# Patient Record
Sex: Male | Born: 1955 | Race: White | Hispanic: No | Marital: Single
Health system: Southern US, Community
[De-identification: ages and names within clinical notes are randomized; demographics above are authoritative.]

## PROBLEM LIST (undated history)

## (undated) DIAGNOSIS — Z9889 Other specified postprocedural states: Secondary | ICD-10-CM

## (undated) DIAGNOSIS — G459 Transient cerebral ischemic attack, unspecified: Secondary | ICD-10-CM

## (undated) DIAGNOSIS — G4730 Sleep apnea, unspecified: Secondary | ICD-10-CM

---

## 2017-02-21 ENCOUNTER — Emergency Department (HOSPITAL_COMMUNITY): Payer: Non-veteran care

## 2017-02-21 ENCOUNTER — Encounter (HOSPITAL_COMMUNITY): Payer: Self-pay | Admitting: Emergency Medicine

## 2017-02-21 ENCOUNTER — Other Ambulatory Visit: Payer: Self-pay

## 2017-02-21 ENCOUNTER — Emergency Department (HOSPITAL_COMMUNITY)
Admission: EM | Admit: 2017-02-21 | Discharge: 2017-02-21 | Disposition: A | Discharge status: Home / Self Care | Payer: Non-veteran care | Attending: Emergency Medicine | Admitting: Emergency Medicine

## 2017-02-21 DIAGNOSIS — M109 Gout, unspecified: Secondary | ICD-10-CM | POA: Diagnosis not present

## 2017-02-21 DIAGNOSIS — Z79899 Other long term (current) drug therapy: Secondary | ICD-10-CM | POA: Diagnosis not present

## 2017-02-21 DIAGNOSIS — M79675 Pain in left toe(s): Secondary | ICD-10-CM | POA: Diagnosis present

## 2017-02-21 HISTORY — DX: Other specified postprocedural states: Z98.890

## 2017-02-21 HISTORY — DX: Transient cerebral ischemic attack, unspecified: G45.9

## 2017-02-21 HISTORY — DX: Sleep apnea, unspecified: G47.30

## 2017-02-21 MED ORDER — NAPROXEN 500 MG PO TABS: 500.0000 mg | ORAL_TABLET | Freq: Two times a day (BID) | ORAL | 0 refills | Status: DC

## 2017-02-21 MED ORDER — NAPROXEN 500 MG PO TABS: 500.0000 mg | ORAL_TABLET | Freq: Two times a day (BID) | ORAL | 0 refills | Status: AC

## 2017-02-21 MED ORDER — COLCHICINE 0.6 MG PO TABS
1.2000 mg | ORAL_TABLET | Freq: Once | ORAL | Status: AC
  Administered 2017-02-21: 1.2 mg via ORAL
  Filled 2017-02-21: qty 2

## 2017-02-21 MED ORDER — COLCHICINE 0.6 MG PO TABS: 0.6000 mg | ORAL_TABLET | Freq: Every day | ORAL | 0 refills | Status: DC

## 2017-02-21 MED ORDER — NAPROXEN 250 MG PO TABS
500.0000 mg | ORAL_TABLET | Freq: Once | ORAL | Status: AC
  Administered 2017-02-21: 500 mg via ORAL
  Filled 2017-02-21: qty 2

## 2017-02-21 MED ORDER — COLCHICINE 0.6 MG PO TABS: 0.6000 mg | ORAL_TABLET | Freq: Every day | ORAL | 0 refills | Status: AC

## 2017-02-21 NOTE — Discharge Instructions (Addendum)
Medications: Naprosyn, colchicine  Treatment: Take Naprosyn twice daily for 1 week.  Take colchicine once daily for 4 days.  Follow-up: Please follow-up with your doctor for further evaluation and treatment.  Please return to the emergency department if you develop any new or worsening symptoms including spreading of pain and redness, fevers, or any other new or concerning symptoms.

## 2017-02-21 NOTE — ED Notes (Signed)
Pt placed in a post op shoe, and crutches were properly sized to the pt's height.

## 2017-02-21 NOTE — ED Provider Notes (Signed)
[facility] EMERGENCY DEPARTMENT Provider Note   CSN: [id number] Arrival date & time: 02/21/17  4098     History   Chief Complaint Chief Complaint  Patient presents with  . Toe Pain    HPI Mark Prince is a 61 y.o. male with history of TIA, and chronic back pain who presents with acute onset left great toe pain that began yesterday.  He has had associated redness and warmth to the area.  He has no history of gout.  He denies any numbness or tingling.  He has no other complaints.  He denies any fevers.  He has been able to walk, however with pain.  HPI  Past Medical History:  Diagnosis Date  . Hx of neck surgery   . Sleep apnea   . TIA (transient ischemic attack)     There are no active problems to display for this patient.   History reviewed. No pertinent surgical history.     Home Medications    Prior to Admission medications   Medication Sig Start Date End Date Taking? Authorizing Provider  ARIPiprazole (ABILIFY) 10 MG tablet Take 10 mg by mouth daily.   Yes [provider]  DULoxetine (CYMBALTA) 30 MG capsule Take 90 mg by mouth daily.   Yes [provider]  finasteride (PROSCAR) 5 MG tablet Take 5 mg by mouth daily.   Yes [provider]  furosemide (LASIX) 10 MG/ML solution Take by mouth daily.   Yes [provider]  gabapentin (NEURONTIN) 300 MG capsule Take 600 mg by mouth 3 (three) times daily.   Yes [provider]  naproxen (NAPROSYN) 500 MG tablet Take 500 mg by mouth 2 (two) times daily as needed for mild pain.   Yes [provider]  tamsulosin (FLOMAX) 0.4 MG CAPS capsule Take 0.8 mg by mouth daily after supper.   Yes [provider]  traZODone (DESYREL) 100 MG tablet Take 100 mg by mouth at bedtime.   Yes [provider]  colchicine 0.6 MG tablet Take 1 tablet (0.6 mg total) by mouth daily. 02/21/17   [facility], Waylan Boga, PA-C  naproxen (NAPROSYN) 500 MG tablet Take  1 tablet (500 mg total) by mouth 2 (two) times daily. 02/21/17   Emi Holes, PA-C    Family History No family history on file.  Social History Social History   Tobacco Use  . Smoking status: Not on file  Substance Use Topics  . Alcohol use: Not on file  . Drug use: Not on file     Allergies   Percocet [oxycodone-acetaminophen]; Tolmetin; and Vistaril [hydroxyzine hcl]   Review of Systems Review of Systems  Constitutional: Negative for fever.  Musculoskeletal: Positive for arthralgias (L great MTP).     Physical Exam Updated Vital Signs BP (!) 116/95 (BP Location: Right Arm)   Pulse (!) 115   Temp 98 F (36.7 C)   Resp 16   Ht 6' (1.829 m)   Wt 119.3 kg (263 lb)   SpO2 95%   BMI 35.67 kg/m   Physical Exam  Constitutional: He appears well-developed and well-nourished. No distress.  HENT:  Head: Normocephalic and atraumatic.  Mouth/Throat: Oropharynx is clear and moist. No oropharyngeal exudate.  Eyes: Conjunctivae are normal. Pupils are equal, round, and reactive to light. Right eye exhibits no discharge. Left eye exhibits no discharge. No scleral icterus.  Neck: Normal range of motion. Neck supple. No thyromegaly present.  Cardiovascular: Normal rate, regular rhythm, normal heart  sounds and intact distal pulses. Exam reveals no gallop and no friction rub.  No murmur heard. Pulmonary/Chest: Effort normal and breath sounds normal. No stridor. No respiratory distress. He has no wheezes. He has no rales.  Abdominal: Soft. Bowel sounds are normal. He exhibits no distension. There is no tenderness. There is no rebound and no guarding.  Musculoskeletal: He exhibits no edema.       Feet:  No tenderness to the left ankle or leg; erythema is localized over the first MTP  Lymphadenopathy:    He has no cervical adenopathy.  Neurological: He is alert. Coordination normal.  Skin: Skin is warm and dry. No rash noted. He is not diaphoretic. No pallor.  Psychiatric: He  has a normal mood and affect.  Nursing note and vitals reviewed.    ED Treatments / Results  Labs (all labs ordered are listed, but only abnormal results are displayed) Labs Reviewed - No data to display  EKG  EKG Interpretation None       Radiology Dg Foot Complete Left  Result Date: 02/21/2017 CLINICAL DATA:  Pain for 24 hours. EXAM: LEFT FOOT - COMPLETE 3+ VIEW COMPARISON:  No prior. FINDINGS: Soft tissue swelling noted of the first MTP joint. No radiopaque foreign body. Cystic changes noted the distal aspect of the first metatarsal. Although these changes may be degenerative a process such as gout cannot be excluded. No evidence fracture or dislocation. IMPRESSION: Soft tissue swelling the first MTP joint. Cystic changes in the distal aspect of the first metatarsal. Although these changes may be degenerative a process such as gout cannot be excluded. No evidence of fracture or dislocation. Electronically Signed   By: Maisie Fushomas  Register   On: 02/21/2017 11:59    Procedures Procedures (including critical care time)  Medications Ordered in ED Medications  naproxen (NAPROSYN) tablet 500 mg (500 mg Oral Given 02/21/17 1227)  colchicine tablet 1.2 mg (1.2 mg Oral Given 02/21/17 1226)     Initial Impression / Assessment and Plan / ED Course  I have reviewed the triage vital signs and the nursing notes.  Pertinent labs & imaging results that were available during my care of the patient were reviewed by me and considered in my medical decision making (see chart for details).     Patient with suspected gout to left great MTP joint.  X-ray shows cystic changes in the distal aspect of the first metatarsal, although these changes may be degenerative or process such as gout cannot be excluded; no evidence of fracture or dislocation.  Will treat with Naprosyn and colchicine.  First dose was given in the ED.  Will refer for follow-up to PCP.  Patient requesting crutches to get around.  We  will also give postop shoe for support.  Return precautions discussed.  Patient understands and agrees with plan.  Patient has chronic mild tachycardia, per patient.  Otherwise patient vitals stable throughout ED course and discharged in satisfactory condition.  Final Clinical Impressions(s) / ED Diagnoses   Final diagnoses:  Acute gout involving toe of left foot, unspecified cause    ED Discharge Orders        Ordered    naproxen (NAPROSYN) 500 MG tablet  2 times daily,   Status:  Discontinued     02/21/17 1217    colchicine 0.6 MG tablet  Daily,   Status:  Discontinued     02/21/17 1217    colchicine 0.6 MG tablet  Daily     02/21/17 1219  naproxen (NAPROSYN) 500 MG tablet  2 times daily     02/21/17 1219       Emi HolesLaw, Iliany Losier M, [place]PA-C 02/21/17 1329    Lavera GuiseLiu, Dana Duo, MD 02/21/17 [phone]

## 2017-02-21 NOTE — ED Triage Notes (Addendum)
Pt states he has a "bunyon" on the bottom of his right great toe. States yesterday he has pain to right great toe. No drainage redness or swelling noted to toe. Denies injury Also [place] [facility]. HR 116 in triage.

## 2019-01-06 IMAGING — CR DG FOOT COMPLETE 3+V*L*
3 series · 3 of 3 positions shown · non-contrast
Comparison: No prior.

CLINICAL DATA: Pain for 24 hours.

EXAM:
LEFT FOOT - COMPLETE 3+ VIEW

[foot ap]
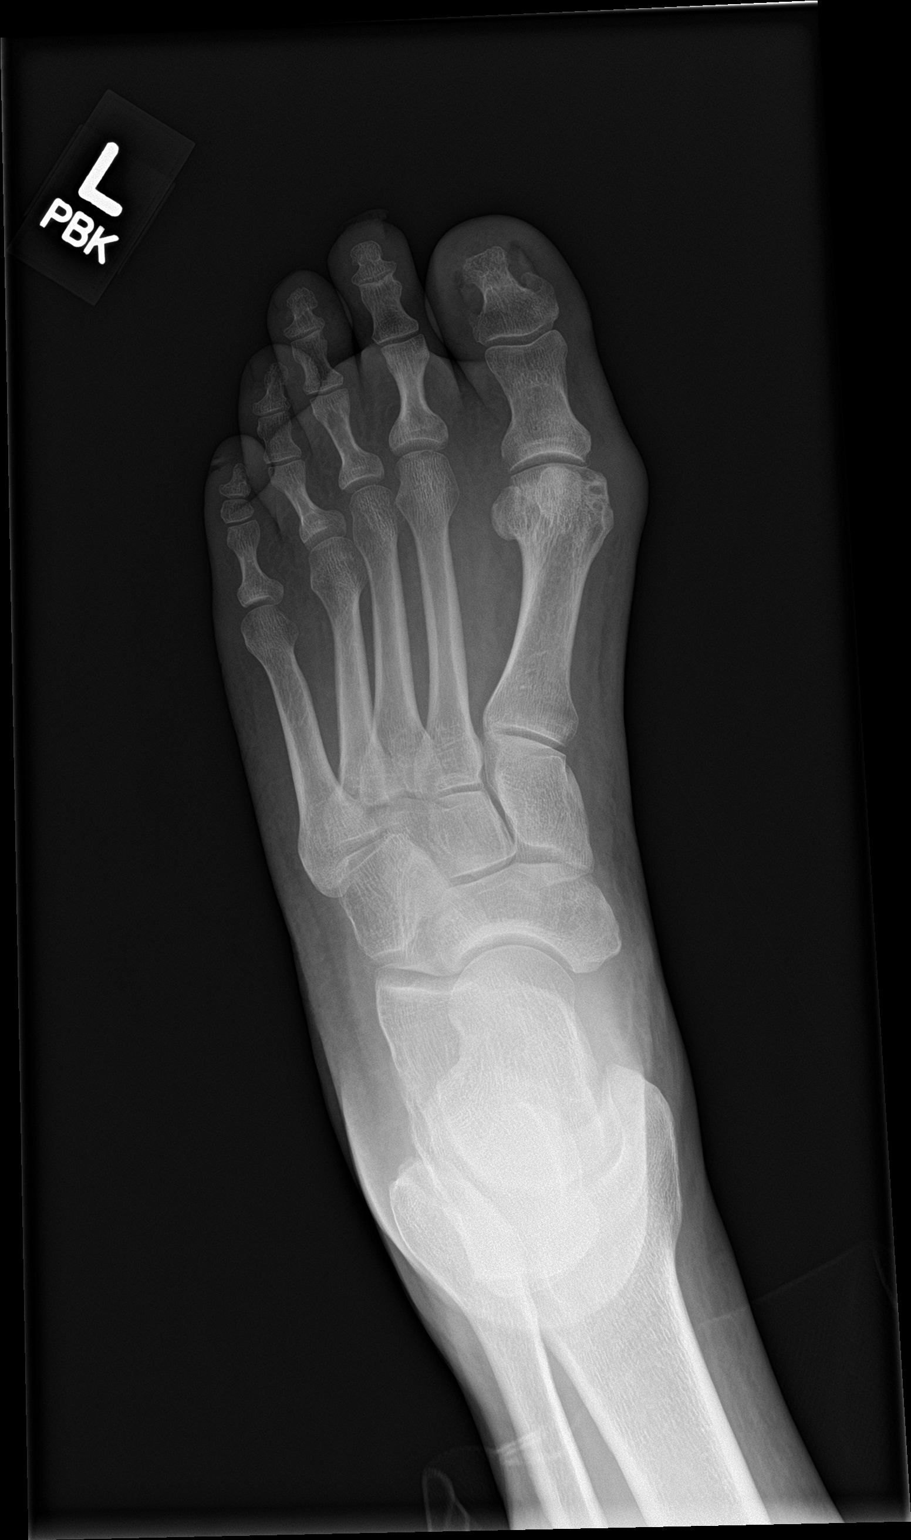

[foot obl]
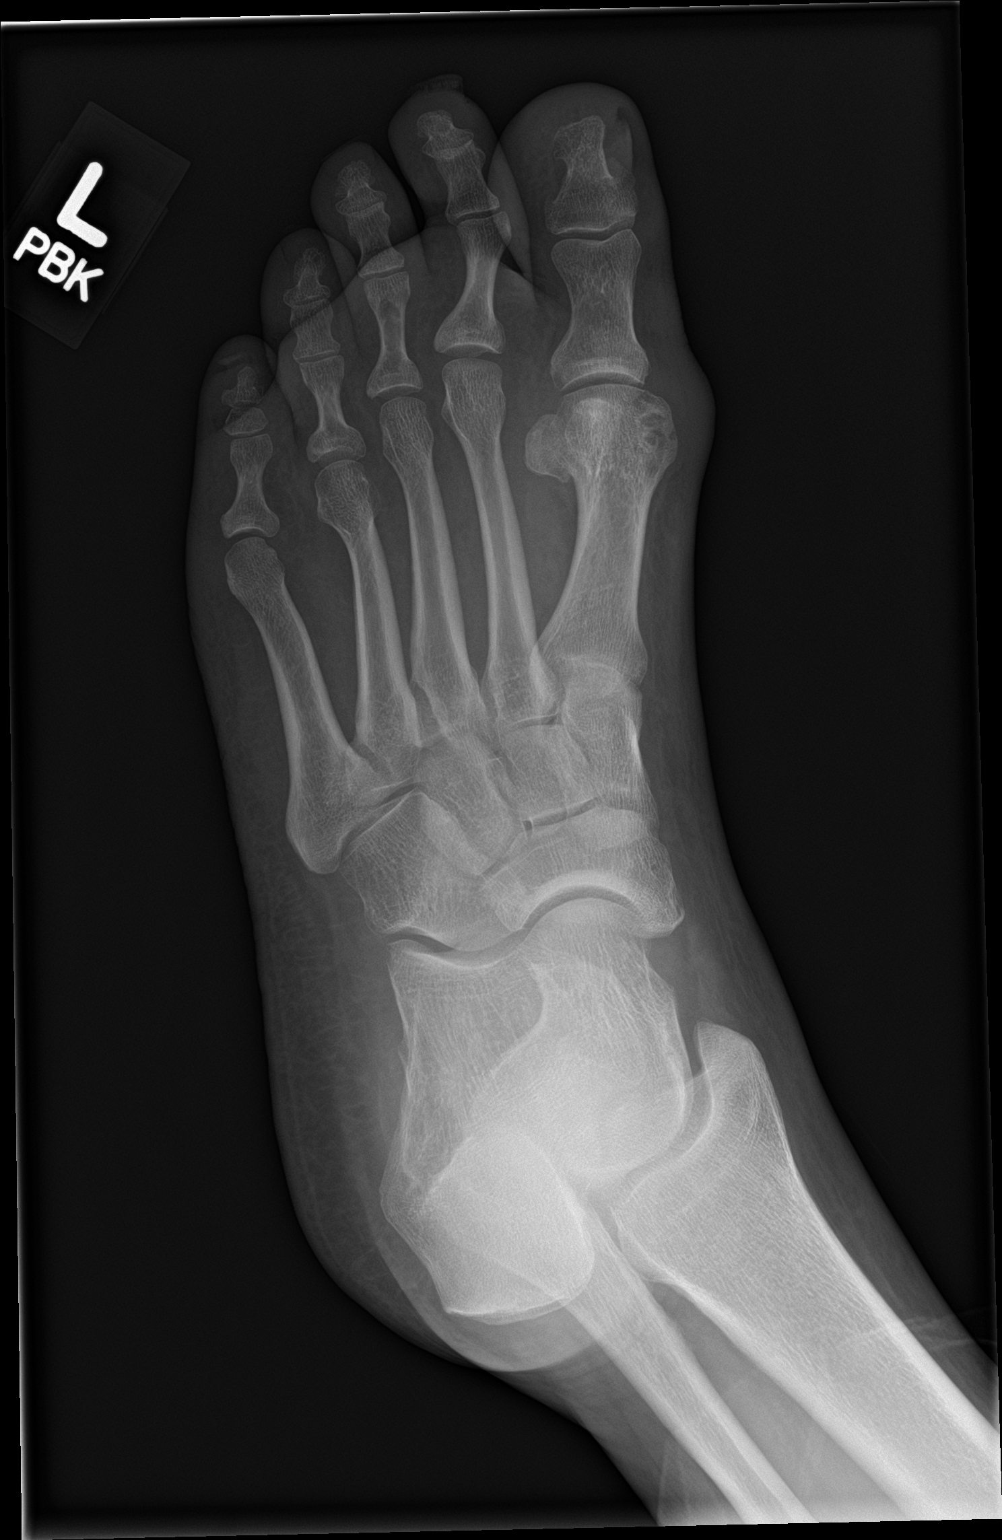

[foot lat]
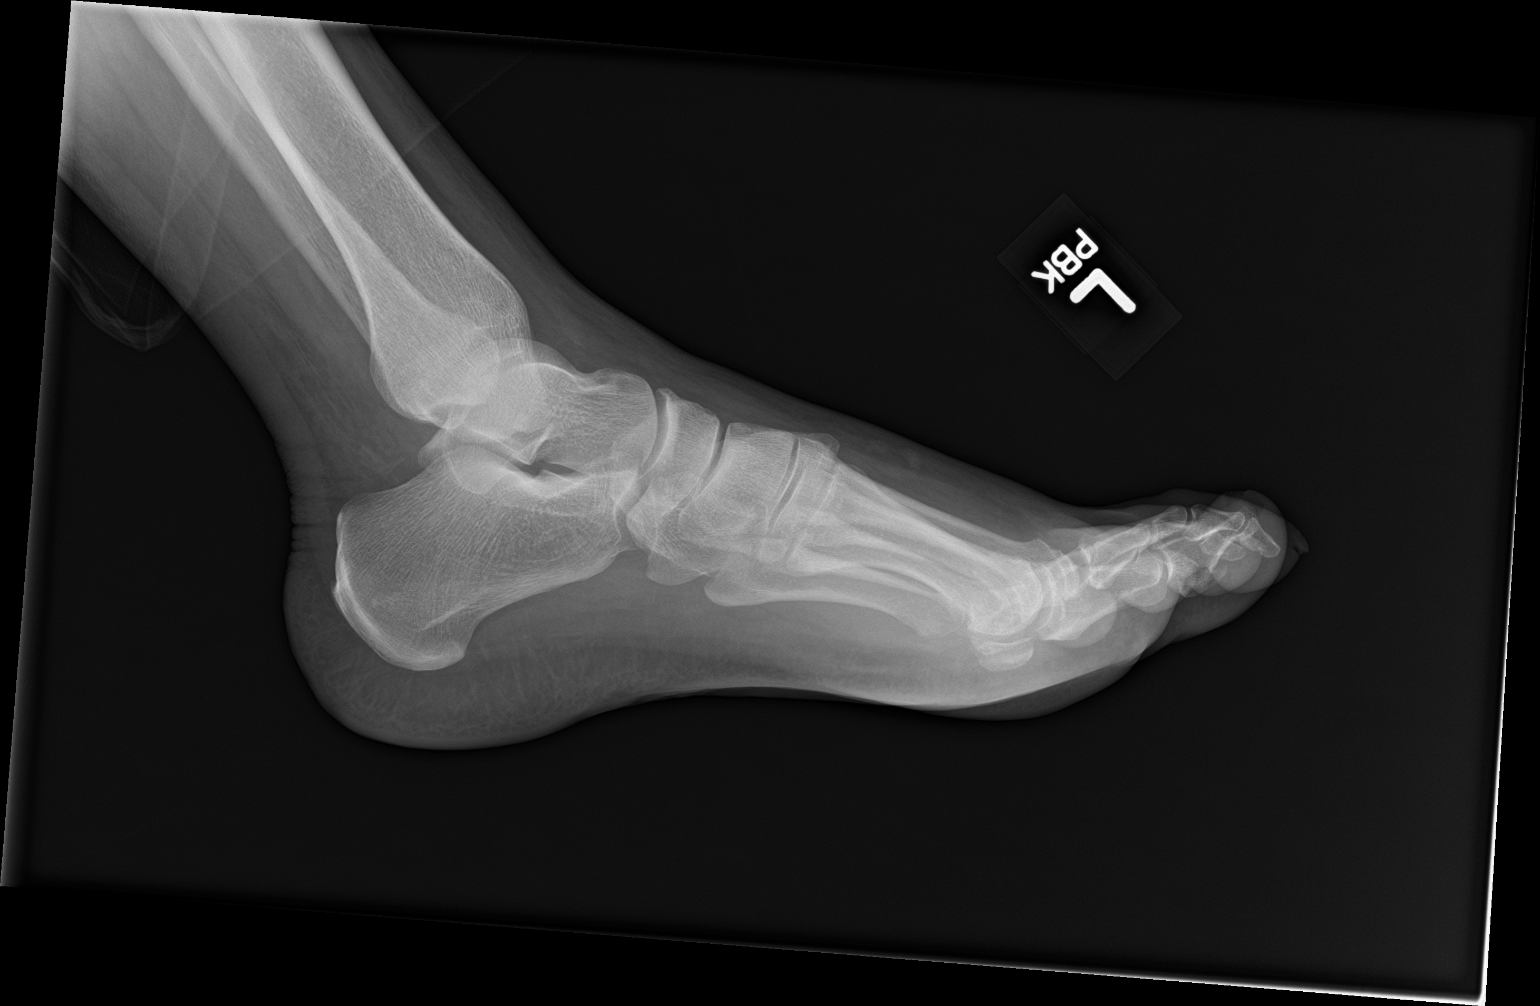

[3 of 3 positions shown; findings below may reference images not displayed]

FINDINGS: Soft tissue swelling noted of the first MTP joint. No radiopaque
foreign body. Cystic changes noted the distal aspect of the first
metatarsal. Although these changes may be degenerative a process
such as gout cannot be excluded. No evidence fracture or
dislocation.
IMPRESSION: Soft tissue swelling the first MTP joint. Cystic changes in the
distal aspect of the first metatarsal. Although these changes may be
degenerative a process such as gout cannot be excluded. No evidence
of fracture or dislocation.
